# Patient Record
Sex: Female | Born: 1985 | Race: White | Hispanic: No | Marital: Single | Smoking: Never smoker
Health system: Southern US, Community
[De-identification: ages and names within clinical notes are randomized; demographics above are authoritative.]

## PROBLEM LIST (undated history)

## (undated) HISTORY — PX: NO PAST SURGERIES: SHX2092

---

## 2011-06-10 ENCOUNTER — Emergency Department (HOSPITAL_COMMUNITY)
Admission: EM | Admit: 2011-06-10 | Discharge: 2011-06-10 | Disposition: A | Discharge status: Home / Self Care | Payer: PRIVATE HEALTH INSURANCE | Attending: Emergency Medicine | Admitting: Emergency Medicine

## 2011-06-10 ENCOUNTER — Emergency Department (HOSPITAL_COMMUNITY): Payer: PRIVATE HEALTH INSURANCE

## 2011-06-10 ENCOUNTER — Encounter (HOSPITAL_COMMUNITY): Payer: Self-pay | Admitting: *Deleted

## 2011-06-10 DIAGNOSIS — S43006A Unspecified dislocation of unspecified shoulder joint, initial encounter: Secondary | ICD-10-CM | POA: Insufficient documentation

## 2011-06-10 DIAGNOSIS — R296 Repeated falls: Secondary | ICD-10-CM | POA: Insufficient documentation

## 2011-06-10 DIAGNOSIS — M25519 Pain in unspecified shoulder: Secondary | ICD-10-CM | POA: Insufficient documentation

## 2011-06-10 DIAGNOSIS — Y9321 Activity, ice skating: Secondary | ICD-10-CM | POA: Insufficient documentation

## 2011-06-10 MED ORDER — IBUPROFEN 800 MG PO TABS
800.0000 mg | ORAL_TABLET | Freq: Once | ORAL | Status: AC
  Administered 2011-06-10: 800 mg via ORAL
  Filled 2011-06-10: qty 1

## 2011-06-10 MED ORDER — IBUPROFEN 600 MG PO TABS: 600.0000 mg | ORAL_TABLET | Freq: Four times a day (QID) | ORAL | Status: AC | PRN

## 2011-06-10 NOTE — ED Notes (Signed)
Family at bedside. 

## 2011-06-10 NOTE — ED Provider Notes (Addendum)
History     CSN: [id number]  Arrival date & time 06/10/11  1622   None     Chief Complaint  Patient presents with  . Shoulder Injury   pleasant young female, injured her right shoulder while ice-skating. Patient fell forward catching herself with her right arm on the wall. The pain is diffuse in the right shoulder. She denied any other injury. No head or neck pain. No numbness, weakness or tingling.  (Consider location/radiation/quality/duration/timing/severity/associated sxs/prior treatment) HPI  History reviewed. No pertinent past medical history.  History reviewed. No pertinent past surgical history.  No family history on file.  History  Substance Use Topics  . Smoking status: Never Smoker   . Smokeless tobacco: Not on file  . Alcohol Use: No    OB History    Grav Para Term Preterm Abortions TAB SAB Ect Mult Living                  Review of Systems  All other systems reviewed and are negative.    Allergies  Review of patient's allergies indicates no known allergies.  Home Medications   Current Outpatient Rx  Name Route Sig Dispense Refill  . IBUPROFEN 200 MG PO TABS Oral Take 200 mg by mouth every 6 (six) hours as needed. For headache      BP 118/90  Pulse 94  Resp 18  SpO2 99%  Physical Exam  Nursing note and vitals reviewed. Constitutional: She appears well-developed and well-nourished. No distress.  HENT:  Head: Normocephalic.  Eyes: Pupils are equal, round, and reactive to light.  Cardiovascular: Normal heart sounds.   Pulmonary/Chest: Breath sounds normal.  Abdominal: Soft.  Musculoskeletal: Normal range of motion.       Diffuse tenderness in right lateral shoulder, with possible bony deformity or dislocation. No ecchymoses or contusions noted.  Neurological: She is alert.  Skin: Skin is warm and dry.    ED Course  Procedures (including critical care time)  Labs Reviewed - No data to display No results found.   No diagnosis  found.    MDM  Pt is seen and examined;  Initial history and physical completed.  Will follow.     Range of motion was attempted. Somewhat limited. Will send for x-rays and reevaluate   5:55 PM Procedures  Close reduction with Cunningham technique. Gentle massage of surrounding musculature in the biceps, and trapezoid area. was used with gentle external rotation and manipulation for range of motion with shoulders backwards. This was attempted for approximately 4 minutes and the shoulder appears to be back in location. Patient is feeling much better. Relocation without the use of any medications using the [facility] technique. The patient tolerated this procedure well. No complications. Range of motion is now back to completely normal  Linc Renne A. Patrica Duel, MD 06/10/11 1756  Reassessed. Patient is fully much better. Range of motion is back to normal. Recommended repeat x-ray, which the patient did not want. This is reasonable. We'll place in a sling given instructions and prescription for ibuprofen. Orthopedic referral for followup. This week.  Discharge home in stable improved condition  Argusta Mcgann A. Patrica Duel, MD 06/10/11 6578

## 2011-06-10 NOTE — ED Notes (Signed)
Patient injured her right shoulder while ice skaking

## 2011-06-10 NOTE — Progress Notes (Signed)
Orthopedic Tech Progress Note Patient Details:  [facility] [place] 1985/08/03 [id number]  Other Ortho Devices Type of Ortho Device: Other (comment) (foam arm sling) Ortho Device Location: right arm Ortho Device Interventions: Application   Nikki Dom 06/10/2011, 6:12 PM

## 2011-06-20 ENCOUNTER — Other Ambulatory Visit: Payer: Self-pay | Admitting: Urology

## 2011-07-18 ENCOUNTER — Ambulatory Visit (HOSPITAL_BASED_OUTPATIENT_CLINIC_OR_DEPARTMENT_OTHER): Admission: RE | Admit: 2011-07-18 | Payer: PRIVATE HEALTH INSURANCE | Source: Ambulatory Visit | Admitting: Urology

## 2011-07-18 ENCOUNTER — Encounter (HOSPITAL_BASED_OUTPATIENT_CLINIC_OR_DEPARTMENT_OTHER): Admission: RE | Payer: Self-pay | Source: Ambulatory Visit

## 2011-07-18 SURGERY — CYSTOSCOPY, WITH BLADDER HYDRODISTENSION
Anesthesia: General

## 2013-04-19 IMAGING — CR DG SHOULDER 2+V*R*
3 series · 3 of 3 positions shown · non-contrast
Comparison: None

CLINICAL DATA: Fall ice skating, landing on right shoulder, pain
and difficulty raising arm

RIGHT SHOULDER - 2+ VIEW

[w shoulder ap internal righ]
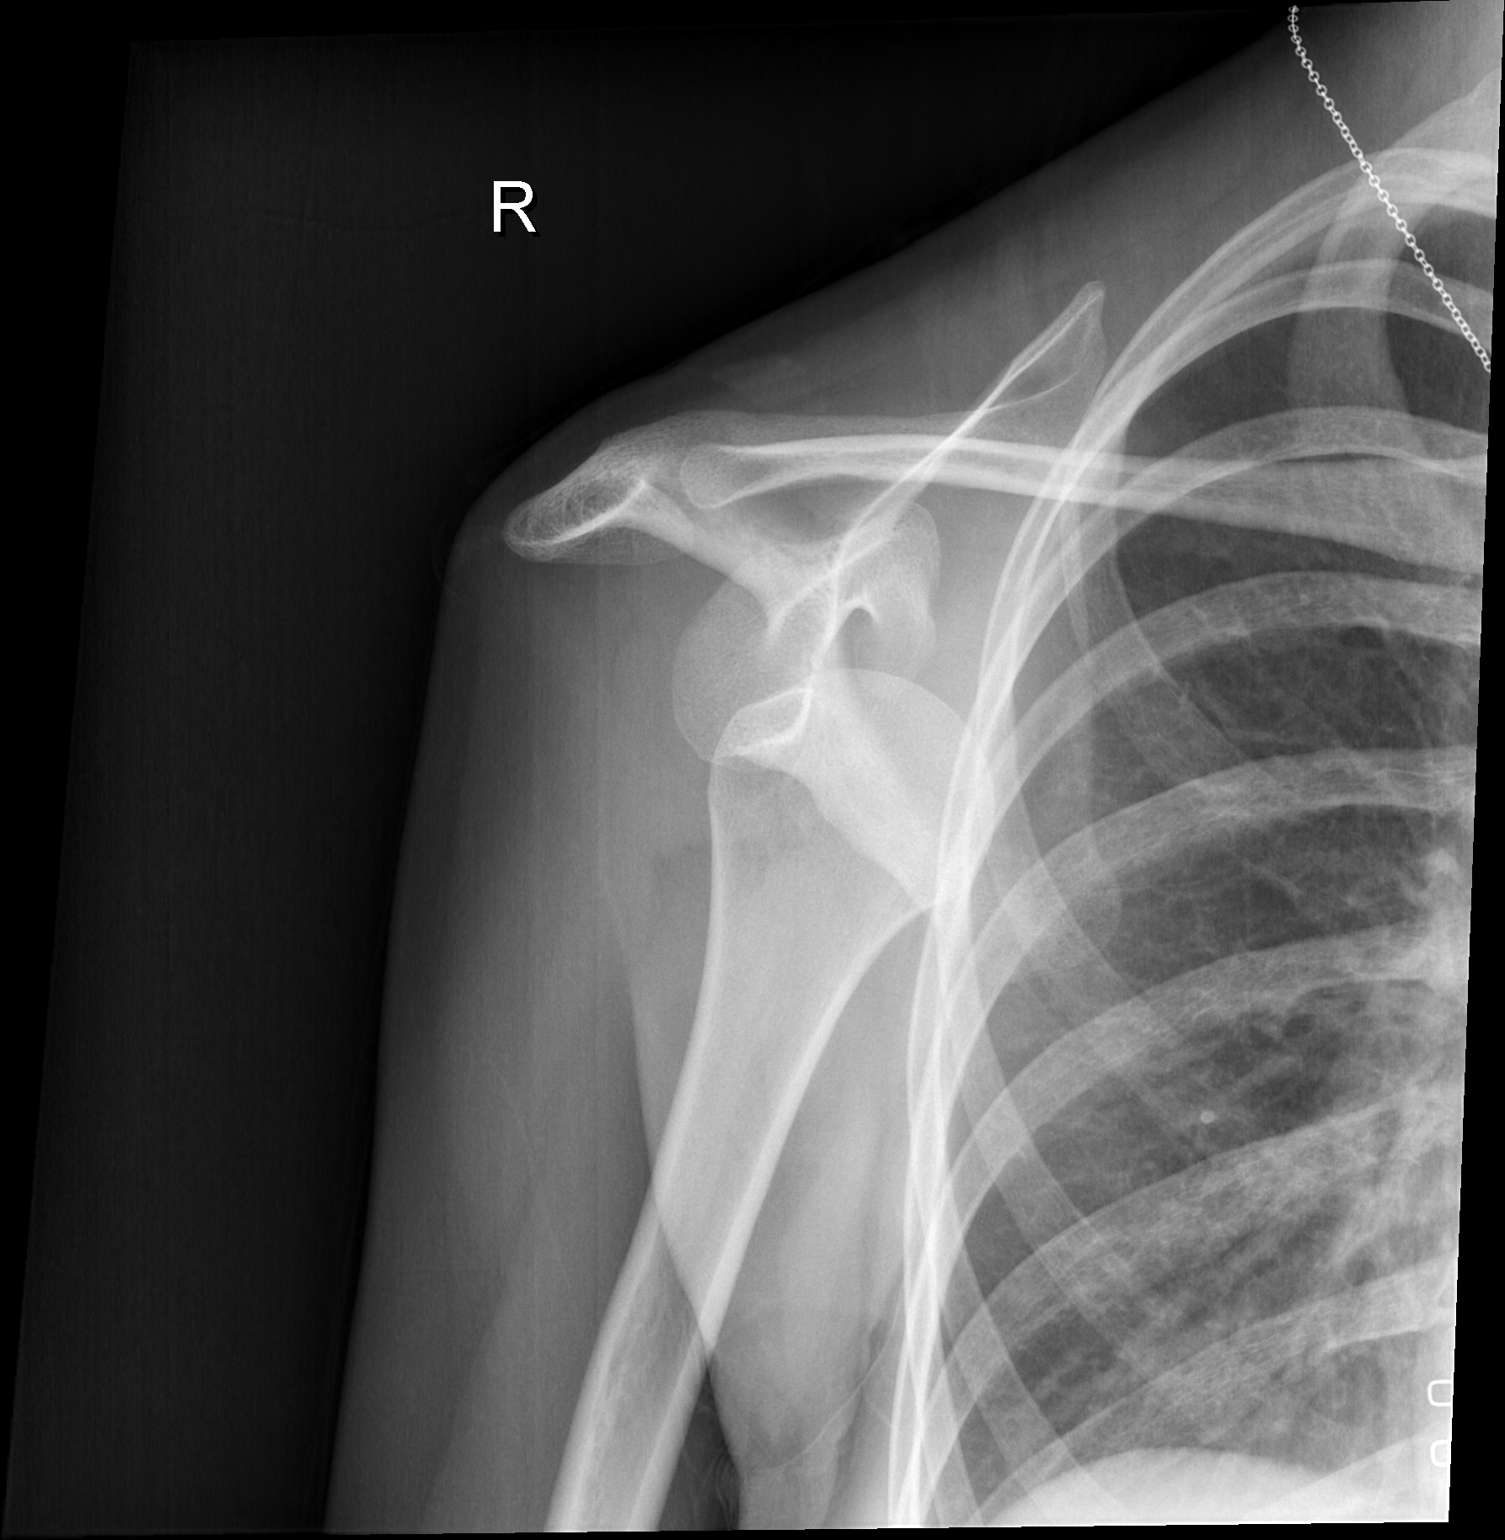

[w shoulder ap external righ]
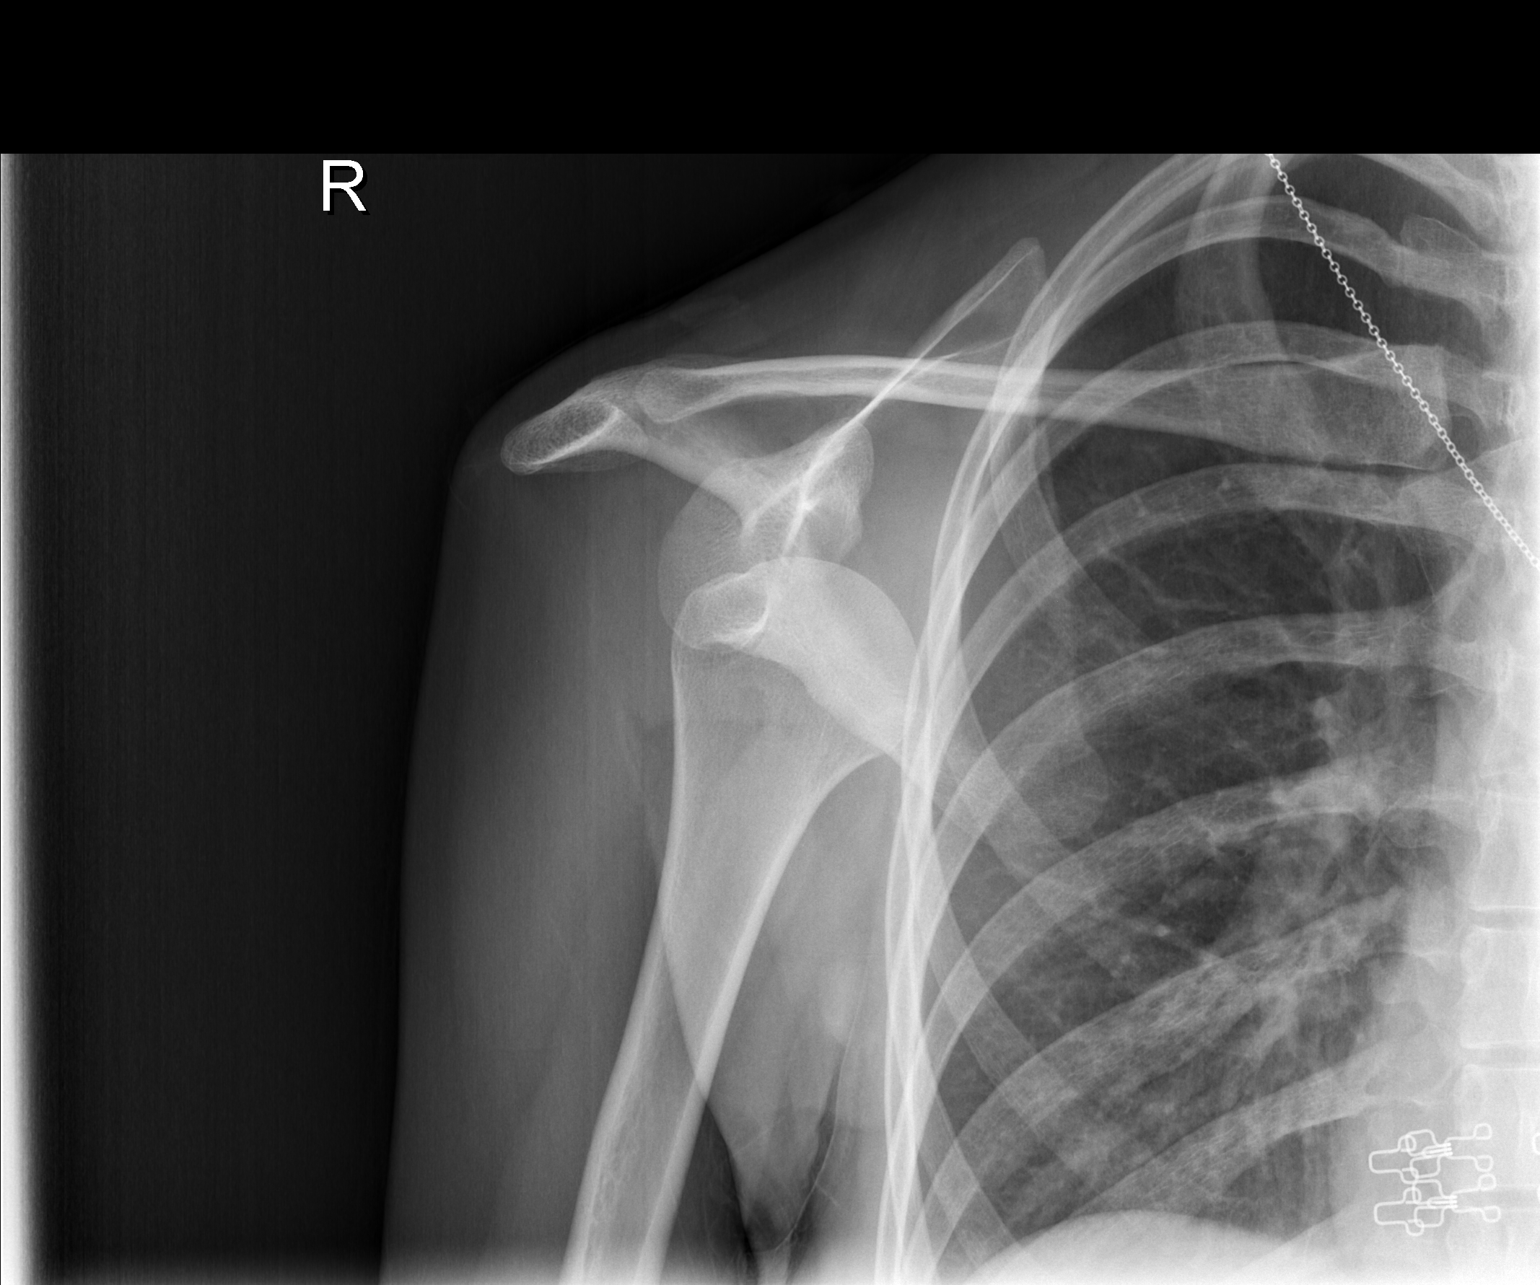

[w shoulder y view right]
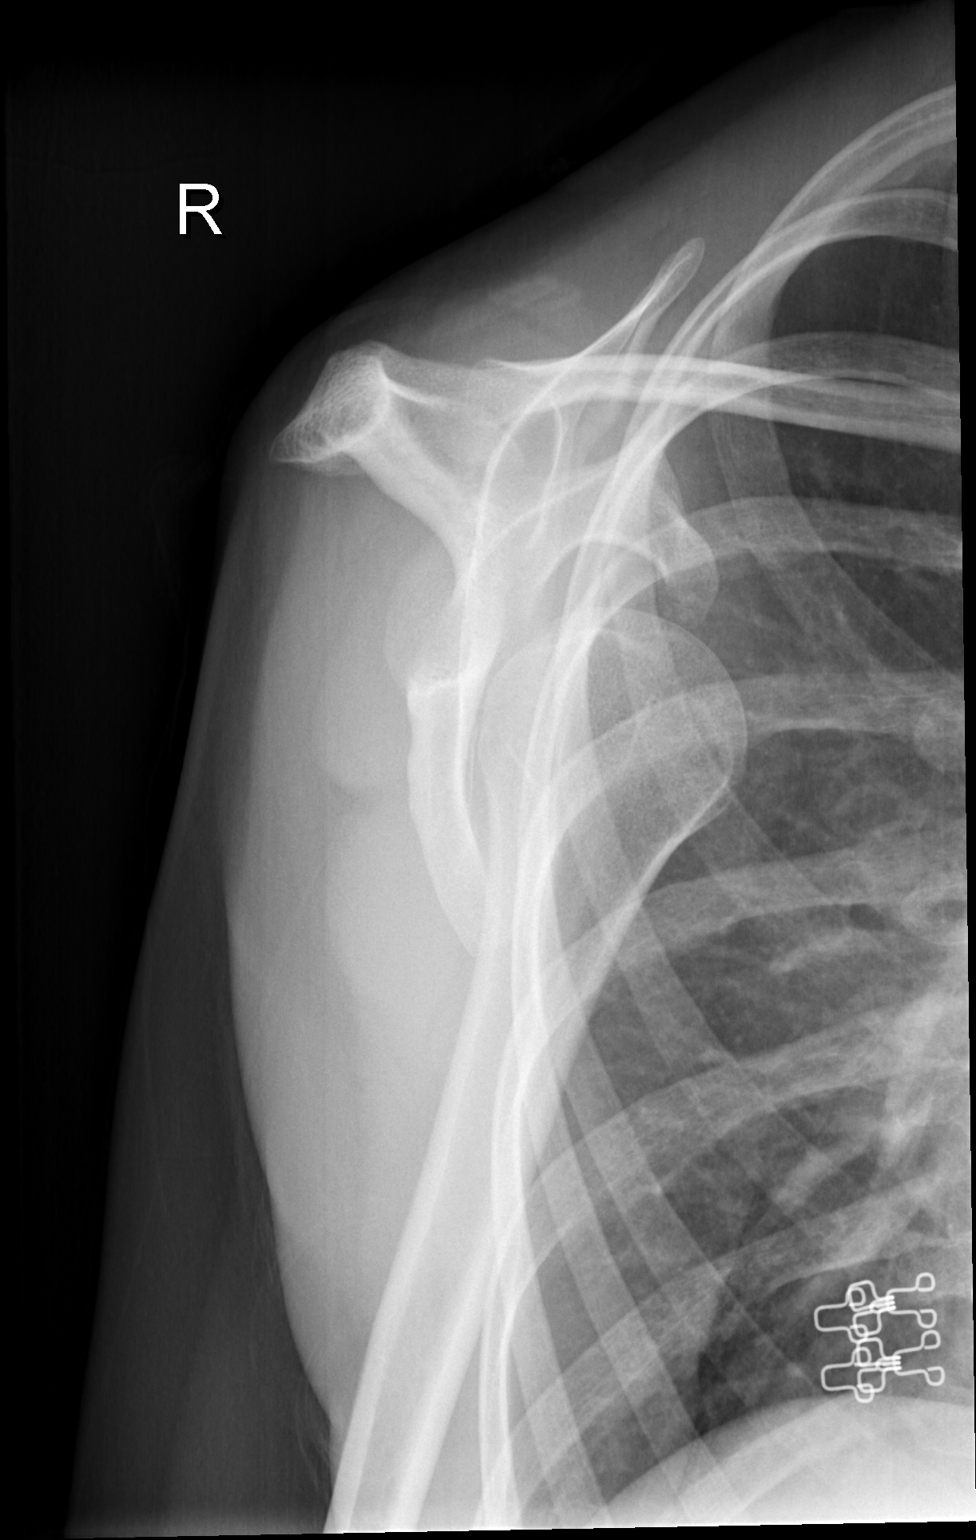

[3 of 3 positions shown; findings below may reference images not displayed]

FINDINGS: Osseous mineralization normal.
AC joint alignment normal.
Anterior right glenohumeral dislocation.
No fracture or bone destruction seen.
Visualized right ribs intact.
IMPRESSION: Anterior right glenohumeral dislocation.

## 2015-09-29 ENCOUNTER — Ambulatory Visit (INDEPENDENT_AMBULATORY_CARE_PROVIDER_SITE_OTHER): Payer: 59 | Admitting: Internal Medicine

## 2015-09-29 ENCOUNTER — Encounter: Payer: Self-pay | Admitting: Internal Medicine

## 2015-09-29 VITALS — BP 100/76 | HR 78 | Temp 98.5°F | Resp 16 | Ht 67.0 in | Wt 121.0 lb

## 2015-09-29 DIAGNOSIS — Z23 Encounter for immunization: Secondary | ICD-10-CM | POA: Diagnosis not present

## 2015-09-29 DIAGNOSIS — Z0000 Encounter for general adult medical examination without abnormal findings: Secondary | ICD-10-CM

## 2015-09-29 DIAGNOSIS — H04123 Dry eye syndrome of bilateral lacrimal glands: Secondary | ICD-10-CM

## 2015-09-29 DIAGNOSIS — M25562 Pain in left knee: Secondary | ICD-10-CM | POA: Diagnosis not present

## 2015-09-29 DIAGNOSIS — H04129 Dry eye syndrome of unspecified lacrimal gland: Secondary | ICD-10-CM | POA: Insufficient documentation

## 2015-09-29 NOTE — Assessment & Plan Note (Addendum)
Left knee pain on medical aspect - with running only Will have her see Dr Katrinka BlazingSmith for further evaluation and treatment.

## 2015-09-29 NOTE — Progress Notes (Signed)
Pre visit review using our clinic review tool, if applicable. No additional management support is needed unless otherwise documented below in the visit note. 

## 2015-09-29 NOTE — Addendum Note (Signed)
Addended by: Zenovia JordanMITCHELL, Quantae Martel B on: 09/29/2015 09:55 AM   Modules accepted: Orders

## 2015-09-29 NOTE — Progress Notes (Signed)
Subjective:    Patient ID: Michele Snow, female    DOB: 1985-07-05, 30 y.o.   MRN: [id number]  HPI She is here to establish with a new pcp.  She is here for a physical exam.   Dry mouth: She drinks a lot of water.  She feels it worse more in the morning.  She does wear a retainer at night.  She is not taking anything other than what is on her med list.   Left knee pain:  She used to run but is not running any longer due to left medial knee pain.  She does the elliptical and denies pain.  She does not have pain with walking or stairs.  She denies swelling.    Medications and allergies reviewed with patient and updated if appropriate.  Patient Active Problem List   Diagnosis Date Noted  . Dry eye 09/29/2015  . Knee pain, left 09/29/2015    Current Outpatient Prescriptions on File Prior to Visit  Medication Sig Dispense Refill  . ibuprofen (ADVIL,MOTRIN) 200 MG tablet Take 200 mg by mouth every 6 (six) hours as needed. For headache     No current facility-administered medications on file prior to visit.    History reviewed. No pertinent past medical history.  Past Surgical History  Procedure Laterality Date  . No past surgeries      Social History   Social History  . Marital Status: Single    Spouse Name: N/A  . Number of Children: N/A  . Years of Education: N/A   Social History Main Topics  . Smoking status: Never Smoker   . Smokeless tobacco: Never Used  . Alcohol Use: Yes     Comment: social  . Drug Use: No  . Sexual Activity: Not Asked   Other Topics Concern  . None   Social History Narrative   Exercise: 4 days a week         Works at [employer]    Family History  Problem Relation Age of Onset  . Throat cancer Father   . Cancer Paternal Grandmother   . Cancer Paternal Grandfather     Review of Systems  Constitutional: Negative for fever, chills, appetite change and fatigue.  HENT: Negative for hearing loss.   Eyes: Negative for  visual disturbance.  Respiratory: Negative for cough, shortness of breath and wheezing.   Cardiovascular: Negative for chest pain, palpitations and leg swelling.  Gastrointestinal: Positive for constipation. Negative for nausea, abdominal pain, diarrhea and blood in stool.       No gerd  Genitourinary: Negative for dysuria and hematuria.  Musculoskeletal: Positive for arthralgias (left knee pain). Negative for myalgias and back pain.  Neurological: Positive for headaches (occasional). Negative for dizziness and light-headedness.  Psychiatric/Behavioral: Negative for dysphoric mood. The patient is not nervous/anxious.        Objective:   Filed Vitals:   09/29/15 0918  BP: 100/76  Pulse: 78  Temp: 98.5 F (36.9 C)  Resp: 16   Filed Weights   09/29/15 0918  Weight: 121 lb (54.885 kg)   Body mass index is 18.95 kg/(m^2).   Physical Exam Constitutional: She appears well-developed and well-nourished. No distress.  HENT:  Head: Normocephalic and atraumatic.  Right Ear: External ear normal. Normal ear canal and TM Left Ear: External ear normal.  Normal ear canal and TM Mouth/Throat: Oropharynx is clear and moist.  Eyes: Conjunctivae and EOM are normal.  Neck: Neck supple. No tracheal  deviation present. No thyromegaly present.  No carotid bruit  Cardiovascular: Normal rate, regular rhythm and normal heart sounds.   No murmur heard.  No edema. Pulmonary/Chest: Effort normal and breath sounds normal. No respiratory distress. She has no wheezes. She has no rales.  Breast: deferred to Gyn Abdominal: Soft. She exhibits no distension. There is no tenderness.  Lymphadenopathy: She has no cervical adenopathy.  Skin: Skin is warm and dry. She is not diaphoretic.  Psychiatric: She has a normal mood and affect. Her behavior is normal.       Assessment & Plan:   Physical exam: Screening blood work  ordered Immunizations  tdap today Gyn Up to date  Eye exams - up to date, wears  contacts Exercise - regular Weight - normal Skin  -  Has derm appt Substance abuse - no evidence of abuse  Dry mouth: Possibly related to using retainer at night Trial of a humidifier at night Less likely Sjogren's syndrome   Follow up as needed

## 2015-09-29 NOTE — Patient Instructions (Addendum)
  Test(s) ordered today. Your results will be released to MyChart (or called to you) after review, usually within 72hours after test completion. If any changes need to be made, you will be notified at that same time.  All other Health Maintenance issues reviewed.   All recommended immunizations and age-appropriate screenings are up-to-date or discussed.  Tetanus vaccine administered today.   Medications reviewed and updated.  No changes recommended at this time.     Please followup as needed

## 2015-10-14 ENCOUNTER — Ambulatory Visit: Payer: 59 | Admitting: Family Medicine

## 2015-11-03 ENCOUNTER — Telehealth: Payer: Self-pay | Admitting: Internal Medicine

## 2015-11-03 DIAGNOSIS — H04123 Dry eye syndrome of bilateral lacrimal glands: Secondary | ICD-10-CM

## 2015-11-03 NOTE — Telephone Encounter (Signed)
D you recall seeing a note from pts eye doc?

## 2015-11-03 NOTE — Telephone Encounter (Signed)
Pt states her eye doctor was sending a letter to Dr. Lawerance BachBurns on Monday and she wanted her to follow up with her pcp regarding her thyroid I'm not sure if you received the letter but she wanted to go ahead and schedule for Monday. I hope the 15 min slot at 1:30 was ok.

## 2015-11-03 NOTE — Telephone Encounter (Signed)
Yes, they wanted us to screening for thyroid disease and Sjogren's. Blood work ordered. She does not need to fast. Please forward to [facility] when results are in - fax #[phone]. Attention Dr. Noel Geroldohen

## 2015-11-04 NOTE — Telephone Encounter (Signed)
Spoke with pt to inform. SHe will come in Monday before her appt to have blood work done.

## 2015-11-08 ENCOUNTER — Other Ambulatory Visit (INDEPENDENT_AMBULATORY_CARE_PROVIDER_SITE_OTHER): Payer: 59

## 2015-11-08 ENCOUNTER — Encounter: Payer: Self-pay | Admitting: Internal Medicine

## 2015-11-08 ENCOUNTER — Ambulatory Visit (INDEPENDENT_AMBULATORY_CARE_PROVIDER_SITE_OTHER): Payer: 59 | Admitting: Internal Medicine

## 2015-11-08 VITALS — BP 114/78 | HR 70 | Temp 98.0°F | Resp 16 | Wt 122.0 lb

## 2015-11-08 DIAGNOSIS — H04123 Dry eye syndrome of bilateral lacrimal glands: Secondary | ICD-10-CM

## 2015-11-08 LAB — CBC WITH DIFFERENTIAL/PLATELET
BASOS PCT: 0.3 % (ref 0.0–3.0)
Basophils Absolute: 0 10*3/uL (ref 0.0–0.1)
EOS ABS: 0.1 10*3/uL (ref 0.0–0.7)
EOS PCT: 1.3 % (ref 0.0–5.0)
HCT: 41.4 % (ref 36.0–46.0)
HEMOGLOBIN: 14.4 g/dL (ref 12.0–15.0)
LYMPHS ABS: 1.6 10*3/uL (ref 0.7–4.0)
Lymphocytes Relative: 32.2 % (ref 12.0–46.0)
MCHC: 34.9 g/dL (ref 30.0–36.0)
MCV: 90.2 fl (ref 78.0–100.0)
MONO ABS: 0.4 10*3/uL (ref 0.1–1.0)
Monocytes Relative: 7.3 % (ref 3.0–12.0)
NEUTROS PCT: 58.9 % (ref 43.0–77.0)
Neutro Abs: 3 10*3/uL (ref 1.4–7.7)
Platelets: 206 10*3/uL (ref 150.0–400.0)
RBC: 4.59 Mil/uL (ref 3.87–5.11)
RDW: 12.4 % (ref 11.5–15.5)
WBC: 5.1 10*3/uL (ref 4.0–10.5)

## 2015-11-08 LAB — TSH: TSH: 1.25 u[IU]/mL (ref 0.35–4.50)

## 2015-11-08 LAB — COMPREHENSIVE METABOLIC PANEL
ALT: 14 U/L (ref 0–35)
AST: 15 U/L (ref 0–37)
Albumin: 4.3 g/dL (ref 3.5–5.2)
Alkaline Phosphatase: 32 U/L — ABNORMAL LOW (ref 39–117)
BUN: 13 mg/dL (ref 6–23)
CHLORIDE: 104 meq/L (ref 96–112)
CO2: 28 meq/L (ref 19–32)
CREATININE: 0.8 mg/dL (ref 0.40–1.20)
Calcium: 9.2 mg/dL (ref 8.4–10.5)
GFR: 90.86 mL/min (ref >60.00)
GLUCOSE: 81 mg/dL (ref 70–99)
POTASSIUM: 4.1 meq/L (ref 3.5–5.1)
SODIUM: 139 meq/L (ref 135–145)
Total Bilirubin: 0.5 mg/dL (ref 0.2–1.2)
Total Protein: 7.2 g/dL (ref 6.0–8.3)

## 2015-11-08 LAB — T4, FREE: FREE T4: 0.81 ng/dL (ref 0.60–1.60)

## 2015-11-08 NOTE — Progress Notes (Signed)
Subjective:    Patient ID: Michele Snow, female    DOB: 04-21-1986, 30 y.o.   MRN: [id number]  HPI She is here for follow up of dry eyes.    She has seen her eye doctor and she is concerned that she is so young and has dry eyes.  She wants us to rule out thyroid disease and Sjogren's.  She denies symptoms consistent with a low thyroid.  She states occasional nose bleeds in the winter.  She has a dry mouth at times.  She does not know her family history.    Medications and allergies reviewed with patient and updated if appropriate.  Patient Active Problem List   Diagnosis Date Noted  . Dry eye 09/29/2015  . Knee pain, left 09/29/2015    Current Outpatient Prescriptions on File Prior to Visit  Medication Sig Dispense Refill  . cycloSPORINE (RESTASIS) 0.05 % ophthalmic emulsion 1 drop 2 (two) times daily.    Marland Kitchen. etonogestrel-ethinyl estradiol (NUVARING) 0.12-0.015 MG/24HR vaginal ring Place 1 each vaginally every 28 (twenty-eight) days. Insert vaginally and leave in place for 3 consecutive weeks, then remove for 1 week.    Marland Kitchen. ibuprofen (ADVIL,MOTRIN) 200 MG tablet Take 200 mg by mouth every 6 (six) hours as needed. For headache     No current facility-administered medications on file prior to visit.    No past medical history on file.  Past Surgical History  Procedure Laterality Date  . No past surgeries      Social History   Social History  . Marital Status: Single    Spouse Name: N/A  . Number of Children: N/A  . Years of Education: N/A   Social History Main Topics  . Smoking status: Never Smoker   . Smokeless tobacco: Never Used  . Alcohol Use: Yes     Comment: social  . Drug Use: No  . Sexual Activity: Not on file   Other Topics Concern  . 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 Throat cancer Father   . Cancer Paternal Grandmother   .  Cancer Paternal Grandfather     Review of Systems  HENT: Positive for nosebleeds (occasionally if air is dry, winter-time). Negative for ear pain, sinus pressure and sore throat.        Dry mouth at times  Eyes: Negative for pain and visual disturbance.  Respiratory: Negative for cough, shortness of breath and wheezing.   Cardiovascular: Negative for chest pain and palpitations.  Gastrointestinal: Negative for nausea and abdominal pain.       No gerd  Genitourinary: Negative for dysuria and hematuria.  Musculoskeletal: Negative for arthralgias.  Neurological: Positive for headaches (a few over the past week - more frequently in past few weeks). Negative for dizziness, weakness, light-headedness and numbness.       Objective:   Filed Vitals:   11/08/15 1327  BP: 114/78  Pulse: 70  Temp: 98 F (36.7 C)  Resp: 16   Filed Weights   11/08/15 1327  Weight: 122 lb (55.339 kg)   Body mass index is 19.1 kg/(m^2).   Physical Exam  Constitutional: She appears well-developed and well-nourished. No distress.  HENT:  Head: Normocephalic and atraumatic.  Right Ear: External ear normal.  Left Ear: External ear normal.  Nose:  Nose normal.  Mouth/Throat: Oropharynx is clear and moist. No oropharyngeal exudate.  B/l ear canals and TM normal  Eyes: Conjunctivae are normal.  Neck: Neck supple. No tracheal deviation present. No thyromegaly present.  Cardiovascular: Normal rate, regular rhythm and normal heart sounds.   No murmur heard. Pulmonary/Chest: Effort normal and breath sounds normal. No respiratory distress. She has no wheezes. She has no rales.  Musculoskeletal: She exhibits no edema.  Lymphadenopathy:    She has no cervical adenopathy.  Skin: Skin is warm and dry. She is not diaphoretic.  Psychiatric: She has a normal mood and affect. Her behavior is normal.          Assessment & Plan:   See Problem List for Assessment and Plan of chronic medical problems.  F/u prn

## 2015-11-08 NOTE — Assessment & Plan Note (Signed)
Check sjogren's labs and tsh, cbc, cmp Will forward results to eye doctor If sjogren's is positive will refer to rheum

## 2015-11-08 NOTE — Patient Instructions (Signed)
  Test(s) ordered today. Your results will be released to MyChart (or called to you) after review, usually within 72hours after test completion. If any changes need to be made, you will be notified at that same time.   Please follow up as needed

## 2015-11-08 NOTE — Progress Notes (Signed)
Pre visit review using our clinic review tool, if applicable. No additional management support is needed unless otherwise documented below in the visit note. 

## 2015-11-09 LAB — SJOGRENS SYNDROME-A EXTRACTABLE NUCLEAR ANTIBODY: SSA (RO) (ENA) ANTIBODY, IGG: NEGATIVE

## 2015-11-09 LAB — SJOGRENS SYNDROME-B EXTRACTABLE NUCLEAR ANTIBODY: SSB (LA) (ENA) ANTIBODY, IGG: NEGATIVE

## 2015-11-11 ENCOUNTER — Encounter: Payer: Self-pay | Admitting: Emergency Medicine

## 2016-03-01 ENCOUNTER — Ambulatory Visit: Payer: 59 | Admitting: Internal Medicine

## 2016-03-14 ENCOUNTER — Encounter: Payer: Self-pay | Admitting: Podiatry

## 2016-03-14 ENCOUNTER — Other Ambulatory Visit: Payer: Self-pay | Admitting: *Deleted

## 2016-03-14 ENCOUNTER — Ambulatory Visit (INDEPENDENT_AMBULATORY_CARE_PROVIDER_SITE_OTHER): Payer: 59

## 2016-03-14 ENCOUNTER — Telehealth: Payer: Self-pay | Admitting: *Deleted

## 2016-03-14 ENCOUNTER — Ambulatory Visit (INDEPENDENT_AMBULATORY_CARE_PROVIDER_SITE_OTHER): Payer: 59 | Admitting: Podiatry

## 2016-03-14 DIAGNOSIS — M2012 Hallux valgus (acquired), left foot: Secondary | ICD-10-CM | POA: Diagnosis not present

## 2016-03-14 NOTE — Telephone Encounter (Signed)
"  I thought I would better go ahead and decide.  I'd like to move my surgery date from November 30 to December 15."  Okay, I will reschedule it.  Chip BoerVicki will give you a call with the arrival time.

## 2016-03-14 NOTE — Patient Instructions (Signed)
Pre-Operative Instructions  Congratulations, you have decided to take an important step to improving your quality of life.  You can be assured that the doctors of [facility] will be with you every step of the way.  1. Plan to be at the surgery center/hospital at least 1 (one) hour prior to your scheduled time unless otherwise directed by the surgical center/hospital staff.  You must have a responsible adult accompany you, remain during the surgery and drive you home.  Make sure you have directions to the surgical center/hospital and know how to get there on time. 2. For hospital based surgery you will need to obtain a history and physical form from your family physician within 1 month prior to the date of surgery- we will give you a form for you primary physician.  3. We make every effort to accommodate the date you request for surgery.  There are however, times where surgery dates or times have to be moved.  We will contact you as soon as possible if a change in schedule is required.   4. No Aspirin/Ibuprofen for one week before surgery.  If you are on aspirin, any non-steroidal anti-inflammatory medications (Mobic, Aleve, Ibuprofen) you should stop taking it 7 days prior to your surgery.  You make take Tylenol  For pain prior to surgery.  5. Medications- If you are taking daily heart and blood pressure medications, seizure, reflux, allergy, asthma, anxiety, pain or diabetes medications, make sure the surgery center/hospital is aware before the day of surgery so they may notify you which medications to take or avoid the day of surgery. 6. No food or drink after midnight the night before surgery unless directed otherwise by surgical center/hospital staff. 7. No alcoholic beverages 24 hours prior to surgery.  No smoking 24 hours prior to or 24 hours after surgery. 8. Wear loose pants or shorts- loose enough to fit over bandages, boots, and casts. 9. No slip on shoes, sneakers are best. 10. Bring  your boot with you to the surgery center/hospital.  Also bring crutches or a walker if your physician has prescribed it for you.  If you do not have this equipment, it will be provided for you after surgery. 11. If you have not been contracted by the surgery center/hospital by the day before your surgery, call to confirm the date and time of your surgery. 12. Leave-time from work may vary depending on the type of surgery you have.  Appropriate arrangements should be made prior to surgery with your employer. 13. Prescriptions will be provided immediately following surgery by your doctor.  Have these filled as soon as possible after surgery and take the medication as directed. 14. Remove nail polish on the operative foot. 15. Wash the night before surgery.  The night before surgery wash the foot and leg well with the antibacterial soap provided and water paying special attention to beneath the toenails and in between the toes.  Rinse thoroughly with water and dry well with a towel.  Perform this wash unless told not to do so by your physician.  Enclosed: 1 Ice pack (please put in freezer the night before surgery)   1 Hibiclens skin cleaner   Pre-op Instructions  If you have any questions regarding the instructions, do not hesitate to call our office.  [place]: [address] [place], [place] [address] [phone]  [place]: [address] [place], [place] [address] [phone]  [place]: [address]  [place], [place] [address] [phone]   Dr.   Norman Regal DPM, Dr. Matthew Wagoner DPM, Dr. M. Todd Sherlynn [place] DPM, Dr. Titorya Stover DPM 

## 2016-03-14 NOTE — Telephone Encounter (Signed)
"  I was just there and scheduled surgery.  I would like to get an estimate Michele Snow(Michele Snow).  Also, what other days does he have available in December?"  He can do it any Wednesday in December.  "Okay, let me think about it.  I would like to get that estimate."  I will get Michele BoerVicki to give you a call.

## 2016-03-14 NOTE — Progress Notes (Signed)
   Subjective:    Patient ID: Michele Snow, female    DOB: Oct 14, 1985, 30 y.o.   MRN: [id number]  HPI: She presents today with a chief complaint of a painful bunion to her left foot. She states this far she no she's had all of her life and it seems to be getting worse. 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 States that it is starting to affect her ability to perform her daily activities and to enjoy life. She states some days is red and sore and most of the time is just a comfortable shoe gear. States the last several months it has been getting worse. She has tried anti-inflammatories in the past to no avail. She is tried appropriate shoe gear but is unable to wear open shoe gear to work.    Review of Systems  All other systems reviewed and are negative.      Objective:   Physical Exam: Vital signs are stable she is alert and oriented 3. Pulses are strongly palpable. Neurologic sensorium is intact per Semmes-Weinstein monofilament. Deep tendon reflexes are intact and equal bilateral. Muscle strength +5 over 5 dorsiflexion plantar flexors and inverters everters all intrinsic musculature is equal and symmetrical bilateral. Orthopedic evaluation demonstrates rectus foot type bilateral with mild to moderate hallux abductovalgus deformity of the left foot. She has some tenderness on palpation and range of motion of the first metatarsophalangeal joint and on palpation of the medial aspect of the hypertrophic medial condyle. Radiographs 3 views of the left leg taken today in the office demonstrated normal osseous mature foot. An increase in the first intermetatarsal angle and hallux abductus angle is consistent with hallux abductovalgus deformity of the left foot.        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  Plan: We discussed the etiology pathology conservative  versus surgical therapies. At this point she would like to entertain surgical intervention. We discussed the pros and cons of surgery and great detail today after discussing an Austin-type bunion repair with screw fixation. I answered all the questions regarding these procedures best of my ability in layman's terms. She understood it was amenable to it and signed operative consent form. I will follow up with her in the near future for surgical intervention. We did discuss the pros and cons of surgery including overcorrection, under correction we discussed postop pain bleeding swelling infection recurrence need for further surgery loss of limb loss of life loss of digit. I will follow-up with her in the near future surgical intervention.

## 2016-03-22 ENCOUNTER — Telehealth: Payer: Self-pay | Admitting: *Deleted

## 2016-03-22 NOTE — Telephone Encounter (Signed)
I am calling to let you know your post operative time is scheduled for December 21 at 10:30 am.  "Okay, do you know when the next one will be or will that be scheduled when I come in for my first follow-up?"  It will be scheduled when you come in for your post operative appointment.  "When does that normally occur?"  It is usually about 3 weeks later.

## 2016-03-22 NOTE — Telephone Encounter (Signed)
-----   Message from Darreld McleanJessica M Smith sent at 03/22/2016  8:25 AM EDT ----- Regarding: RE: phone call Contact: [phone] Kasi Lasky, her first post-op is scheduled for Thursday December 21 at 10:30 am. Thanks.  ----- Message ----- From: Enedina Finnerelydia J Jla Reynolds Sent: 03/21/2016   5:24 PM To: Darreld McleanJessica M Smith Subject: FW: phone call                                 Shanda BumpsJessica can you schedule this patient's post op appt please?  Thank you, Saachi Zale ----- Message ----- From: Ree Edmanachel A Komonski Sent: 03/21/2016  12:45 PM To: Enedina Finnerelydia J Araina Butrick Subject: phone call                                     Patient called this afternoon questioning the day/time of a post-op visit. I see where her surgery is scheduled for December but I was unable to give her any more information about her post-op visits. She states that she left you a voicemail yesterday but has not received a call back. I told her I would forward her message to you. The best # to reach her at is [phone].

## 2016-05-09 ENCOUNTER — Other Ambulatory Visit: Payer: Self-pay | Admitting: Podiatry

## 2016-05-09 MED ORDER — OXYCODONE-ACETAMINOPHEN 10-325 MG PO TABS: ORAL_TABLET | ORAL | 0 refills | Status: AC

## 2016-05-09 MED ORDER — CEPHALEXIN 500 MG PO CAPS: 500.0000 mg | ORAL_CAPSULE | Freq: Three times a day (TID) | ORAL | 0 refills | Status: DC

## 2016-05-09 MED ORDER — ONDANSETRON HCL 4 MG PO TABS: 4.0000 mg | ORAL_TABLET | Freq: Three times a day (TID) | ORAL | 0 refills | Status: DC | PRN

## 2016-05-12 ENCOUNTER — Encounter: Payer: Self-pay | Admitting: Podiatry

## 2016-05-12 DIAGNOSIS — M2012 Hallux valgus (acquired), left foot: Secondary | ICD-10-CM | POA: Diagnosis not present

## 2016-05-15 ENCOUNTER — Telehealth: Payer: Self-pay

## 2016-05-15 NOTE — Telephone Encounter (Signed)
LVM for patient to call with questions or concerns regarding post op status 

## 2016-05-17 NOTE — Progress Notes (Signed)
DOS 12.15.2017 Austin Bunion Repair with Screw Left Foot

## 2016-05-18 ENCOUNTER — Ambulatory Visit (INDEPENDENT_AMBULATORY_CARE_PROVIDER_SITE_OTHER): Payer: BLUE CROSS/BLUE SHIELD

## 2016-05-18 ENCOUNTER — Ambulatory Visit (INDEPENDENT_AMBULATORY_CARE_PROVIDER_SITE_OTHER): Payer: Self-pay | Admitting: Podiatry

## 2016-05-18 VITALS — Temp 98.7°F

## 2016-05-18 DIAGNOSIS — M2012 Hallux valgus (acquired), left foot: Secondary | ICD-10-CM

## 2016-05-18 NOTE — Progress Notes (Signed)
She presents today for follow-up of her surgical foot left. She is status post [place] bunion repair left foot denies fever chills nausea vomiting muscle aches and pains.  Objective: Vital signs are stable alert and oriented 3. Pulses are palpable. Dry sterile dressing intact was removed demonstrates considerable leading to the dressing which has dried she has a very small amount of active bleeding at this 0.31 small space in her incision. She has considerable ecchymosis about the foot but radiographically appears to be perfectly placed as far as her osteotomy and internal fixation. There is moderate edema overlying the dorsum of the foot most likely a hematoma.  Assessment: Healing surgical foot 1 week left.  Plan: Redressed today dry sterile compressive dressing place her back in her cam walker and I will follow-up with her in 1 week.

## 2016-05-25 ENCOUNTER — Ambulatory Visit (INDEPENDENT_AMBULATORY_CARE_PROVIDER_SITE_OTHER): Payer: BLUE CROSS/BLUE SHIELD | Admitting: Podiatry

## 2016-05-25 DIAGNOSIS — M2012 Hallux valgus (acquired), left foot: Secondary | ICD-10-CM | POA: Diagnosis not present

## 2016-05-25 NOTE — Progress Notes (Signed)
She presents today for her second postop visit she is status post [place] bunionectomy left foot date of surgery 05/12/2016. She denies pain. States that it feels pretty good. She continues to wear her cam walker regularly. She presents requesting to get out of the [employer].  Objective: Vital signs are stable she is alert and oriented 3. Pulses are palpable. Dry sterile dressing was removed demonstrate sutures are intact margins well coapted with remove the sutures today. She has great range of motion of the first metatarsophalangeal joint of the left foot without pain or complication.  Assessment: Healing surgical foot left.  Plan: Placed her compression anklet today in a [employer] shoe we will follow-up with her in 2 weeks for another set of x-rays.

## 2016-06-08 ENCOUNTER — Ambulatory Visit (INDEPENDENT_AMBULATORY_CARE_PROVIDER_SITE_OTHER): Payer: BLUE CROSS/BLUE SHIELD

## 2016-06-08 ENCOUNTER — Ambulatory Visit (INDEPENDENT_AMBULATORY_CARE_PROVIDER_SITE_OTHER): Payer: Self-pay | Admitting: Podiatry

## 2016-06-08 DIAGNOSIS — M2012 Hallux valgus (acquired), left foot: Secondary | ICD-10-CM | POA: Diagnosis not present

## 2016-06-08 NOTE — Progress Notes (Signed)
She presents today for follow-up of her bunion repair left foot. Date of surgery was 05/12/2016. She states that she is doing very well.  Objective: Vital signs are stable she is alert and oriented 3. Pulses are palpable. She has good range of motion of the first metatarsophalangeal joint left foot. Incision sites along the medial uneventfully. Radiographs demonstrate first metatarsal osteotomy with internal fixation in good position.  Assessment:Well-healed surgical foot.  Plan: I'm allow her to get back into her tennis shoes and I will follow-up with her in 1 month. Another set of x-rays will be necessary at that time.

## 2016-07-11 ENCOUNTER — Encounter: Payer: Self-pay | Admitting: Podiatry

## 2016-07-11 ENCOUNTER — Ambulatory Visit (INDEPENDENT_AMBULATORY_CARE_PROVIDER_SITE_OTHER): Payer: BLUE CROSS/BLUE SHIELD

## 2016-07-11 ENCOUNTER — Ambulatory Visit (INDEPENDENT_AMBULATORY_CARE_PROVIDER_SITE_OTHER): Payer: Self-pay | Admitting: Podiatry

## 2016-07-11 DIAGNOSIS — M2012 Hallux valgus (acquired), left foot: Secondary | ICD-10-CM

## 2016-07-11 NOTE — Progress Notes (Signed)
She presents today for her final postop visit date of surgery is 05/12/2016 [facility] states that is doing great she has no problems with it whatsoever states this seems to be getting better daily basis.  Objective: Vital signs are stable alert and oriented 3 no erythema is mild edema no cellulitis drainage or odor she has good range of motion of first metatarsophalangeal joint dorsiflexion and plantarflexion. Radial S taken today demonstrate well-healing osteotomy.  Assessment: Well-healing surgical foot.  Plan: We'll follow up with her on an as-needed basis.
